# Patient Record
Sex: Male | Born: 1985 | Race: White | Hispanic: No | Marital: Single | Smoking: Never smoker
Health system: Southern US, Community
[De-identification: ages and names within clinical notes are randomized; demographics above are authoritative.]

## PROBLEM LIST (undated history)

## (undated) DIAGNOSIS — E669 Obesity, unspecified: Secondary | ICD-10-CM

## (undated) DIAGNOSIS — J302 Other seasonal allergic rhinitis: Secondary | ICD-10-CM

## (undated) DIAGNOSIS — R12 Heartburn: Secondary | ICD-10-CM

## (undated) DIAGNOSIS — R030 Elevated blood-pressure reading, without diagnosis of hypertension: Secondary | ICD-10-CM

## (undated) DIAGNOSIS — K469 Unspecified abdominal hernia without obstruction or gangrene: Secondary | ICD-10-CM

## (undated) DIAGNOSIS — F4010 Social phobia, unspecified: Secondary | ICD-10-CM

## (undated) DIAGNOSIS — IMO0001 Reserved for inherently not codable concepts without codable children: Secondary | ICD-10-CM

## (undated) DIAGNOSIS — K219 Gastro-esophageal reflux disease without esophagitis: Secondary | ICD-10-CM

## (undated) HISTORY — DX: Other seasonal allergic rhinitis: J30.2

## (undated) HISTORY — DX: Heartburn: R12

## (undated) HISTORY — DX: Social phobia, unspecified: F40.10

## (undated) HISTORY — DX: Obesity, unspecified: E66.9

## (undated) HISTORY — DX: Elevated blood-pressure reading, without diagnosis of hypertension: R03.0

## (undated) HISTORY — PX: OTHER SURGICAL HISTORY: SHX169

## (undated) HISTORY — DX: Reserved for inherently not codable concepts without codable children: IMO0001

---

## 2012-03-07 ENCOUNTER — Other Ambulatory Visit: Payer: Self-pay | Admitting: Family Medicine

## 2012-03-07 LAB — BASIC METABOLIC PANEL
Calcium, Total: 8.8 mg/dL (ref 8.5–10.1)
Chloride: 108 mmol/L — ABNORMAL HIGH (ref 98–107)
Co2: 27 mmol/L (ref 21–32)
EGFR (African American): >60
EGFR (Non-African Amer.): >60
Glucose: 93 mg/dL (ref 65–99)
Osmolality: 281 (ref 275–301)
Potassium: 3.6 mmol/L (ref 3.5–5.1)
Sodium: 142 mmol/L (ref 136–145)

## 2012-03-07 LAB — LIPID PANEL
Cholesterol: 179 mg/dL (ref 0–200)
HDL Cholesterol: 27 mg/dL — ABNORMAL LOW (ref 40–60)
Triglycerides: 202 mg/dL — ABNORMAL HIGH (ref 0–200)
VLDL Cholesterol, Calc: 40 mg/dL (ref 5–40)

## 2012-03-07 LAB — HEMOGLOBIN A1C: Hemoglobin A1C: 5.4 % (ref 4.2–6.3)

## 2014-10-22 ENCOUNTER — Emergency Department
Admission: EM | Admit: 2014-10-22 | Discharge: 2014-10-22 | Disposition: A | Discharge status: Home / Self Care | Payer: Self-pay | Attending: Emergency Medicine | Admitting: Emergency Medicine

## 2014-10-22 ENCOUNTER — Emergency Department: Payer: Self-pay

## 2014-10-22 ENCOUNTER — Encounter: Payer: Self-pay | Admitting: Emergency Medicine

## 2014-10-22 DIAGNOSIS — M79604 Pain in right leg: Secondary | ICD-10-CM | POA: Insufficient documentation

## 2014-10-22 DIAGNOSIS — Z79899 Other long term (current) drug therapy: Secondary | ICD-10-CM | POA: Insufficient documentation

## 2014-10-22 MED ORDER — ETODOLAC 400 MG PO TABS: 400.0000 mg | ORAL_TABLET | Freq: Two times a day (BID) | ORAL | Status: DC

## 2014-10-22 NOTE — ED Notes (Signed)
Developed pain to right lower leg lastpm w/o injury

## 2014-10-22 NOTE — ED Provider Notes (Signed)
[facility] Emergency Department Provider Note  ____________________________________________  Time seen: 1505  I have reviewed the triage vital signs and the nursing notes.   HISTORY  Chief Complaint Leg Pain    HPI William Cohen is a 29 y.o. male noticed that he was having pain in his right anterior leg last p.m. he states that he has not had a recent injury. He does have a lot of anxiety and has been poking on his leg quite often. And this is also to place him where he is having pain today. He denies having any difficulty walking or bending his leg. He denies any paresthesias, bruising, or throbbing. There is been no swelling. He is taken some Aleve without complete resolution of his pain. He rates his pain currently as a 3 out of 10.  History reviewed. No pertinent past medical history.  There are no active problems to display for this patient.   History reviewed. No pertinent past surgical history.  Current Outpatient Rx  Name  Route  Sig  Dispense  Refill  . etodolac (LODINE) 400 MG tablet   Oral   Take 1 tablet (400 mg total) by mouth 2 (two) times daily.   20 tablet   0     Allergies Review of patient's allergies indicates no known allergies.  No family history on file.  Social History History  Substance Use Topics  . Smoking status: Never Smoker   . Smokeless tobacco: Not on file  . Alcohol Use: No    Review of Systems Constitutional: No fever/chills Eyes: No visual changes. ENT: No sore throat. Cardiovascular: Denies chest pain. Respiratory: Denies shortness of breath. Gastrointestinal: No abdominal pain.  No nausea, no vomiting.  Genitourinary: Negative for dysuria. Musculoskeletal: Negative for back pain. Skin: Negative for rash. Neurological: Negative for headaches, focal weakness or numbness.  10-point ROS otherwise negative.  ____________________________________________   PHYSICAL EXAM:  VITAL SIGNS: ED Triage  Vitals  Enc Vitals Group     BP 10/22/14 1337 137/88 mmHg     Pulse Rate 10/22/14 1337 88     Resp 10/22/14 1337 20     Temp 10/22/14 1337 98 F (36.7 C)     Temp Source 10/22/14 1337 Oral     SpO2 10/22/14 1337 97 %     Weight 10/22/14 1337 300 lb (136.079 kg)     Height 10/22/14 1337 6' (1.829 m)     Head Cir --      Peak Flow --      Pain Score 10/22/14 1338 3     Pain Loc --      Pain Edu? --      Excl. in GC? --     Constitutional: Alert and oriented. Well appearing and in no acute distress. Eyes: Conjunctivae are normal. PERRL. EOMI. Head: Atraumatic. Nose: No congestion/rhinnorhea. Neck: No stridor.   Cardiovascular: Normal rate, regular rhythm. Grossly normal heart sounds.  Good peripheral circulation. Respiratory: Normal respiratory effort.  No retractions. Lungs CTAB. Gastrointestinal: Soft and nontender. No distention. Musculoskeletal: No lower extremity tenderness nor edema.  No joint effusions. There is some tenderness on palpation of the anterior tib-fib area. There is no edema or ecchymosis. Skin is intact. Pulses intact. Motor sensory intact. Neurologic:  Normal speech and language. No gross focal neurologic deficits are appreciated. Speech is normal. No gait instability. Skin:  Skin is warm, dry and intact.  Psychiatric: Mood and affect are normal. Speech and behavior are normal.  ____________________________________________   LABS (all labs ordered are listed, but only abnormal results are displayed)  Labs Reviewed - No data to display ____________________________________________ RADIOLOGY  Right tib-fib x-ray is negative for fracture. Soft tissue unremarkable per radiologist. ____________________________________________   PROCEDURES  Procedure(s) performed: None  Critical Care performed: No  ____________________________________________   INITIAL IMPRESSION / ASSESSMENT AND PLAN / ED COURSE  Pertinent labs & imaging results that were available  during my care of the patient were reviewed by me and considered in my medical decision making (see chart for details).  Patient was given information about advanced access to follow-up on his anxiety problems. He was reassured that this is not anything wrong with his bones  he was encouraged to not "poke" at his leg anymore. ____________________________________________   FINAL CLINICAL IMPRESSION(S) / ED DIAGNOSES  Final diagnoses:  Acute leg pain, right      Tommi RumpsRhonda L Summers, PA-C 10/22/14 1620  Tommi Rumpshonda L Summers, PA-C 10/22/14 1620  Phineas SemenGraydon Goodman, MD 10/27/14 [phone]

## 2015-01-04 ENCOUNTER — Encounter (INDEPENDENT_AMBULATORY_CARE_PROVIDER_SITE_OTHER): Payer: Self-pay

## 2015-01-04 ENCOUNTER — Encounter: Payer: Self-pay | Admitting: General Surgery

## 2015-01-04 ENCOUNTER — Ambulatory Visit (INDEPENDENT_AMBULATORY_CARE_PROVIDER_SITE_OTHER): Payer: Self-pay | Admitting: General Surgery

## 2015-01-04 VITALS — BP 160/92 | HR 98 | Temp 98.3°F | Ht 72.0 in | Wt 322.0 lb

## 2015-01-04 DIAGNOSIS — K429 Umbilical hernia without obstruction or gangrene: Secondary | ICD-10-CM | POA: Insufficient documentation

## 2015-01-04 DIAGNOSIS — M6208 Separation of muscle (nontraumatic), other site: Secondary | ICD-10-CM | POA: Insufficient documentation

## 2015-01-04 NOTE — Progress Notes (Signed)
Patient ID: William Cohen, male   DOB: 05-12-1986, 29 y.o.   MRN: [id number]  History of Present Illness William Cohen is a 29 y.o. male with a recent history of a visible bulge from the mid abdomen. Patient states that over the last 6 months he has noticed a soft bulge in the midline of his midepigastric region. It has always been soft and has never caused pain he states that it is mostly visible with consumption of carbonated beverages which he has stopped. Patient has also had a recent weight gain due to sedentary lifestyle. He denies any fevers chills nausea vomiting diarrhea constipation and difficulty breathing chest pain or any other complaints at all other than weight gain. Per his mother, she reports that his father had a similar bulge was repaired recently. No other complaints or problems addressed today.  Past Medical History History reviewed. No pertinent past medical history.   Patient currently on no medications and has never had surgery.  History reviewed. No pertinent past surgical history.  No Known Allergies  Current Outpatient Prescriptions  Medication Sig Dispense Refill  . etodolac (LODINE) 400 MG tablet Take 1 tablet (400 mg total) by mouth 2 (two) times daily. (Patient not taking: Reported on 01/04/2015) 20 tablet 0   No current facility-administered medications for this visit.    Family History Family History  Problem Relation Age of Onset  . Thyroid disease Mother      No family history other than mother's thyroid disease.  Social History History  Substance Use Topics  . Smoking status: Never Smoker   . Smokeless tobacco: Never Used  . Alcohol Use: No     Patient denies smoking, using any illicit drugs, or any alcohol intake. Patient is currently unemployed   ROS A thorough review of systems was completed and negative other than the history of present illness  Physical Exam Blood pressure 160/92, pulse 98, temperature 98.3 F (36.8 C), temperature  source Oral, height 6' (1.829 m), weight 146.058 kg (322 lb).  CONSTITUTIONAL: Obese, pleasant and appropriate EYES: Pupils equal, round, and reactive to light, Sclera non-icteric. EARS, NOSE, MOUTH AND THROAT: The oropharynx is clear. Oral mucosa is pink and moist. Hearing is intact to voice.  NECK: Trachea is midline, and there is no jugular venous distension. Thyroid is without palpable abnormalities. LYMPH NODES:  Lymph nodes in the neck are not enlarged. RESPIRATORY:  Lungs are clear, and breath sounds are equal bilaterally. Normal respiratory effort without pathologic use of accessory muscles. CARDIOVASCULAR: Heart is regular without murmurs, gallops, or rubs. GI: The abdomen is large, soft, nontender, and nondistended. The bulge of concern was in the upper midline between the rectus muscles most consistent with a diastases recti. Upon thorough examination patient was also noted to have a small umbilical hernia that was easily reducible and asymptomatic. There were no palpable masses. There was no hepatosplenomegaly. There were normal bowel sounds. GU: Deferred MUSCULOSKELETAL:  Normal muscle strength and tone in all four extremities.    SKIN: Skin turgor is normal. There are no pathologic skin lesions.  NEUROLOGIC:  Motor and sensation is grossly normal.  Cranial nerves are grossly intact. PSYCH:  Alert and oriented to person, place and time. Affect is normal.  Data Reviewed Previous chart reviewed no images and labs.  I have personally reviewed the patient's imaging and medical records.    Assessment    29 year old obese male with asymptomatic diastases recti and umbilical hernia.  Plan  Had a long conversation with the patient and his mother about the roles of obesity and diastases recti and hernias. Discussed that his current weight that although surgical repair is possible it would be at higher risk of recurrence and of complication due to the patient's size. Also discussed  that due to the asymptomatic nature of both the diastases and the umbilical hernia that there would be no surgical urgency or emergency at this time. Counseled with the patient and the mother about the signs and symptoms of incarceration and strangulation of both the hernia or the diastases should they occur and to report to the nearest emergency room upon onset. All questions answered and patient and mother both relieved that no surgery was currently required.  Face-to-face time spent with the patient and care providers was 30 minutes, with more than 50% of the time spent counseling, educating, and coordinating care of the patient.     Ricarda Frame 01/04/2015, 10:20 AM

## 2015-01-04 NOTE — Patient Instructions (Signed)

## 2015-01-06 ENCOUNTER — Ambulatory Visit: Payer: Self-pay | Admitting: Surgery

## 2015-06-03 ENCOUNTER — Encounter: Payer: Self-pay | Admitting: Family Medicine

## 2015-06-03 ENCOUNTER — Ambulatory Visit (INDEPENDENT_AMBULATORY_CARE_PROVIDER_SITE_OTHER): Payer: Self-pay | Admitting: Family Medicine

## 2015-06-03 VITALS — BP 130/78 | HR 123 | Temp 97.7°F | Resp 16 | Ht 70.0 in | Wt 304.0 lb

## 2015-06-03 DIAGNOSIS — Z6841 Body Mass Index (BMI) 40.0 and over, adult: Secondary | ICD-10-CM | POA: Insufficient documentation

## 2015-06-03 DIAGNOSIS — F419 Anxiety disorder, unspecified: Principal | ICD-10-CM

## 2015-06-03 DIAGNOSIS — F418 Other specified anxiety disorders: Secondary | ICD-10-CM

## 2015-06-03 DIAGNOSIS — F329 Major depressive disorder, single episode, unspecified: Secondary | ICD-10-CM | POA: Insufficient documentation

## 2015-06-03 DIAGNOSIS — E669 Obesity, unspecified: Secondary | ICD-10-CM | POA: Insufficient documentation

## 2015-06-03 DIAGNOSIS — Z23 Encounter for immunization: Secondary | ICD-10-CM

## 2015-06-03 MED ORDER — ESCITALOPRAM OXALATE 10 MG PO TABS: 10.0000 mg | ORAL_TABLET | Freq: Every day | ORAL | Status: DC

## 2015-06-03 MED ORDER — CLONAZEPAM 0.5 MG PO TABS: 0.2500 mg | ORAL_TABLET | ORAL | Status: DC | PRN

## 2015-06-03 MED ORDER — CHOLECALCIFEROL 50 MCG (2000 UT) PO CAPS
1.0000 | ORAL_CAPSULE | Freq: Every day | ORAL | Status: AC
Start: 2015-06-03 — End: ?

## 2015-06-03 NOTE — Progress Notes (Signed)
Subjective:    Patient ID: William Cohen, male    DOB: April 01, 1986, 29 y.o.   MRN: [id number]  HPI: William Cohen is a 29 y.o. male presenting on 06/03/2015 for Anxiety   HPI  Pt presents to establish care today. Previous care provider was [facility] and Dr. Lahoma Rocker 3 years ago.  It has been 3 years since Her last PCP visit. Records from previous provider will be requested and reviewed. Current medical problems include:  Anxiety: Symptoms began last Monday with a panic attack. Became alarmed by bad news- felt very alarmed. Felt hard to breath.   Sleeping a lot more than usual. Has a history of social anxiety. Was in therapy- trying to use his coping skills.  GERD: Occasional symptoms. Very well controlled- diet controlled.  Elevated BP in the past without HTN. BP is elevated today.   Health maintenance:  Labs done in 2013- WNL. Not employed.   Flu shot done today.    Past Medical History  Diagnosis Date  . Phobia, social   . Seasonal allergies   . Heartburn   . Obesity   . Elevated blood pressure    Social History   Social History  . Marital Status: Single    Spouse Name: N/A  . Number of Children: N/A  . Years of Education: N/A   Occupational History  . Not on file.   Social History Main Topics  . Smoking status: Never Smoker   . Smokeless tobacco: Never Used  . Alcohol Use: No  . Drug Use: No  . Sexual Activity: Not on file   Other Topics Concern  . Not on file   Social History Narrative   Family History  Problem Relation Age of Onset  . Thyroid disease Mother   . Anxiety disorder Mother   . Diabetes Maternal Grandfather   . Heart disease Maternal Grandfather   . Stroke Maternal Grandfather    Current Outpatient Prescriptions on File Prior to Visit  Medication Sig  . etodolac (LODINE) 400 MG tablet Take 1 tablet (400 mg total) by mouth 2 (two) times daily. (Patient not taking: Reported on 06/03/2015)   No current facility-administered  medications on file prior to visit.    Review of Systems  Constitutional: Negative for fever and chills.  HENT: Negative.   Respiratory: Negative for chest tightness, shortness of breath and wheezing.   Cardiovascular: Negative for chest pain, palpitations and leg swelling.  Gastrointestinal: Negative for nausea, vomiting and abdominal pain.  Endocrine: Negative.   Genitourinary: Negative for dysuria, urgency, discharge, penile pain and testicular pain.  Musculoskeletal: Negative for back pain, joint swelling and arthralgias.  Skin: Negative.   Neurological: Negative for dizziness, weakness, numbness and headaches.  Psychiatric/Behavioral: Positive for dysphoric mood and decreased concentration. Negative for suicidal ideas, behavioral problems and sleep disturbance. The patient is nervous/anxious.    Per HPI unless specifically indicated above     Objective:    BP 130/78 mmHg  Pulse 123  Temp(Src) 97.7 F (36.5 C) (Oral)  Resp 16  Ht  (1.778 m)  Wt 304 lb (137.893 kg)  BMI 43.62 kg/m2  Wt Readings from Last 3 Encounters:  06/03/15 304 lb (137.893 kg)  01/04/15 322 lb (146.058 kg)  10/22/14 300 lb (136.079 kg)    Depression screen PHQ 2/9 06/03/2015  Down, Depressed, Hopeless 3  PHQ - 2 Score 3  Altered sleeping 3  Tired, decreased energy 3  Change in appetite 3  Feeling  bad or failure about yourself  2  Trouble concentrating 2  Moving slowly or fidgety/restless 2  Suicidal thoughts 0  PHQ-9 Score 18  Difficult doing work/chores Very difficult    GAD 7 : Generalized Anxiety Score 06/03/2015  Nervous, Anxious, on Edge 3  Control/stop worrying 3  Worry too much - different things 3  Trouble relaxing 3  Restless 3  Easily annoyed or irritable 0  Afraid - awful might happen 3  Total GAD 7 Score 18  Anxiety Difficulty Very difficult      Physical Exam  Constitutional: He is oriented to person, place, and time. He appears well-developed and  well-nourished. No distress.  HENT:  Head: Normocephalic and atraumatic.  Neck: Neck supple. No thyromegaly present.  Cardiovascular: Normal rate, regular rhythm and normal heart sounds.  Exam reveals no gallop and no friction rub.   No murmur heard. Pulmonary/Chest: Effort normal and breath sounds normal. He has no wheezes.  Abdominal: Soft. Bowel sounds are normal. He exhibits no distension. There is no tenderness. There is no rebound.  Musculoskeletal: Normal range of motion. He exhibits no edema or tenderness.  Neurological: He is alert and oriented to person, place, and time. He has normal reflexes.  Skin: Skin is warm and dry. No rash noted. No erythema.  Psychiatric: His speech is normal and behavior is normal. Judgment and thought content normal. His mood appears anxious. Cognition and memory are normal.       Assessment & Plan:   Problem List Items Addressed This Visit      Other   Anxiety and depression - Primary    Start medication today. Benzo for PRN panic attacks until fully on escitalopram. Reviewed risks and benefits of medication.  Encouraged daily exercise to help with symptoms. Encouraged vitamin D OTC.  Discussed options of therapy for anxiety. RTC 1 mos.       Relevant Medications   escitalopram (LEXAPRO) 10 MG tablet   clonazePAM (KLONOPIN) 0.5 MG tablet   BMI 40.0-44.9, adult (HCC)    Encouraged 150 minutes of exercise per week to help increase overall health.        Other Visit Diagnoses    Need for influenza vaccination        Relevant Orders    Flu Vaccine QUAD 36+ mos PF IM (Fluarix & Fluzone Quad PF) (Completed)       Meds ordered this encounter  Medications  . escitalopram (LEXAPRO) 10 MG tablet    Sig: Take 1 tablet (10 mg total) by mouth daily.    Dispense:  30 tablet    Refill:  11    Order Specific Question:  Supervising Provider    Answer:  Janeann ForehandHAWKINS JR, JAMES H [phone][[id number]]  . clonazePAM (KLONOPIN) 0.5 MG tablet    Sig: Take 0.5 tablets  (0.25 mg total) by mouth as needed for anxiety.    Dispense:  10 tablet    Refill:  0    Order Specific Question:  Supervising Provider    Answer:  Janeann ForehandHAWKINS JR, JAMES H [phone][[id number]]  . Cholecalciferol 2000 units CAPS    Sig: Take 1 capsule (2,000 Units total) by mouth daily.    Dispense:  30 each    Order Specific Question:  Supervising Provider    Answer:  Janeann ForehandHAWKINS JR, JAMES H [[id number]]      Follow up plan: Return in about 4 weeks (around 07/01/2015) for anxiety.

## 2015-06-03 NOTE — Assessment & Plan Note (Signed)
Encouraged 150 minutes of exercise per week to help increase overall health.

## 2015-06-03 NOTE — Assessment & Plan Note (Signed)
Start medication today. Benzo for PRN panic attacks until fully on escitalopram. Reviewed risks and benefits of medication.  Encouraged daily exercise to help with symptoms. Encouraged vitamin D OTC.  Discussed options of therapy for anxiety. RTC 1 mos.

## 2015-06-03 NOTE — Patient Instructions (Signed)
Lets start treating your anxiety with a medication today. Start escitalopram 10 mg once daily. To get started take 1/2 pill at bedtime for 4 days and then resume the full dose.   For panic attacks I have given you a few tablets of clonazepam. Take 1/2 pill at the start of panic attack as needed.   We will check back on your symptoms in 1 month.

## 2016-06-13 ENCOUNTER — Other Ambulatory Visit: Payer: Self-pay | Admitting: Family Medicine

## 2016-06-13 DIAGNOSIS — F329 Major depressive disorder, single episode, unspecified: Secondary | ICD-10-CM

## 2016-06-13 DIAGNOSIS — F419 Anxiety disorder, unspecified: Principal | ICD-10-CM

## 2016-06-13 MED ORDER — ESCITALOPRAM OXALATE 10 MG PO TABS: 10.0000 mg | ORAL_TABLET | Freq: Every day | ORAL | 2 refills | Status: DC

## 2016-06-13 NOTE — Telephone Encounter (Signed)
Last visit with prior PCP William Cohen in 05/2015 to establish care and manage Anxiety. He was treated with Escitalopram for anxiety, given 1 year supply medication. He was asked to follow-up in 1 month, has not been to office or called since that visit 05/2015.  Received refill request for [facility]ram 10mg  daily[facility] [place] (however this pharmacy was not listed on his pharmacy list). I [facility], #[facility] pills +2 refills, for total 3 month supply.  Patient will need to schedule office visit to follow-up within next 3 months to continue receiving refills for this medication. I have never seen this patient in office, and they may schedule with me or with our incoming new provider replacing Amy Cohen anytime in next 3 months.  William PilarAlexander Smaran Gaus, DO [facility] 06/13/2016, 6:10 PM

## 2016-06-13 NOTE — Telephone Encounter (Signed)
[facility] said [facility] needs a refill on [facility]ram [facility] [place]  Her call back number is [phone]

## 2016-06-14 ENCOUNTER — Encounter: Payer: Self-pay | Admitting: Family Medicine

## 2016-06-14 ENCOUNTER — Encounter: Payer: Self-pay | Admitting: *Deleted

## 2016-06-14 ENCOUNTER — Ambulatory Visit (INDEPENDENT_AMBULATORY_CARE_PROVIDER_SITE_OTHER): Payer: Self-pay | Admitting: Family Medicine

## 2016-06-14 VITALS — BP 130/78 | HR 91 | Temp 97.7°F | Resp 16 | Ht 70.0 in | Wt 321.6 lb

## 2016-06-14 DIAGNOSIS — F418 Other specified anxiety disorders: Secondary | ICD-10-CM

## 2016-06-14 DIAGNOSIS — F419 Anxiety disorder, unspecified: Principal | ICD-10-CM

## 2016-06-14 DIAGNOSIS — R41840 Attention and concentration deficit: Secondary | ICD-10-CM | POA: Insufficient documentation

## 2016-06-14 DIAGNOSIS — F329 Major depressive disorder, single episode, unspecified: Secondary | ICD-10-CM

## 2016-06-14 MED ORDER — ESCITALOPRAM OXALATE 10 MG PO TABS: 10.0000 mg | ORAL_TABLET | Freq: Every day | ORAL | 11 refills | Status: DC

## 2016-06-14 NOTE — Telephone Encounter (Signed)
Patient has appointment 06/14/16.

## 2016-06-14 NOTE — Assessment & Plan Note (Signed)
Significant improvement over past 1 year on SSRI Lexapro, seems primary etiology of symptoms were anxiety with panic, possible more GAD. History of depression but does not seem to endorse this, associated with some insomnia much improved on medication. - Off of BDZ therapy was only on temporary bridge Clonazepam until stable on SSRI - Possible ADD component by history, no prior testing  Plan: 1. Refilled Escitalopram 10mg  daily #30 +11 refills, sent to [facility] (earlier rx see refill note, [facility] changed mind) 2. Discussed discontinue clonazepam, not ideal for chronic management of anxiety, especially if he has not used in many months and feels comfortable without it 3. Offered future self referral to therapy if interested, handout given 4. Consider future ADD testing per local Elna BreslowBruce Thompson or other, handout given, however if uninsured may not be able to afford testing, consider [facility] as potential option 5. Follow-up 6 months for Annual physical and labs

## 2016-06-14 NOTE — Patient Instructions (Signed)
Thank you for coming in to clinic today.  1. Refilled Escitalopram (Lexapro) 10mg  once daily, no dose change. In future, if needed we could always increase up to 20mg .  2. For ADD concern with focus.. Consider contacting [facility]na BreslowB[facility] Thompson for initial testing (unfortunately may be expensive if no insurance coverage, you can also consider [facility] or [facility] setting for other alternative options  Drema BalzarineBruce R Thompson PHD, local Psychologist [address] [place], [place] [address] [phone]  ----------------------  1. [name]   Address: [address], [place], [place] [address] Hours: Open today  9AM-7PM Phone: [phone]  2. [name] [employer] Address: [address], [place], [place] [address] Phone: [phone]   [facility]   Address: [address] [place] [address], [place], [place] [address] Hours: Tues 4:15PM-8PM // Weds 9am - 12pm // Magdalene Mollyhurs 1pm - 8pm (Closed other days) Phone: [phone]  Self Referral [facility]) [place] [address], [place], [place] [address] Phone: [phone]  You will be due for FASTING BLOOD WORK (no food or drink after midnight before, only water or coffee without cream/sugar on the morning of)  - Please go ahead and schedule a "Lab Only" visit in the morning at the clinic for lab draw anytime in next 6 months - Make sure Lab Only appointment is at least 1-2 weeks before your next appointment, so that results will be available  For Lab Results, once available within 2-3 days of blood draw, you can can log in to MyChart online to view your results and a brief explanation. Also, we can discuss results at next follow-up visit.  Please schedule a follow-up appointment with Dr. Althea CharonKaramalegos in 6 months for Annual Physical  If you have any other questions or concerns, please feel free to call the clinic or  send a message through MyChart. You may also schedule an earlier appointment if necessary.  William PilarAlexander Ashrith Sagan, DO [facility]

## 2016-06-14 NOTE — Progress Notes (Signed)
Subjective:    Patient ID: William Cohen, male    DOB: [birth year], 31 y.o.   MRN: [id number]  William Cohen is a 31 y.o. male presenting on 06/14/2016 for Anxiety  Patient presents for a same day appointment.  HPI   ANXIETY with Panic with history of depression: - Reports chronic history of Anxiety with panic disorder, last visit to [facility] 05/2015 established care with prior PCP, at that time initiated therapy with escitalopram, also given clonzepam PRN for panic. - Today he presents to follow-up and request medication refill. Overall he has done well on Escitalopram 100m daily, he only took the Clonazepam PRN when severe anxiety and understood this was temporary bridge medication, he has since finished it and no longer taking it. States he has not had a full blown panic attack in a long time, >1 year, but does have occasional moments of panic however he feels like he can control these much better now. - Previously had more of problem with insomnia, now much improved. Still occasional difficulty but much more manageable - Additionally he reports concern for some inattention or difficulty focusing, he inquires about this as he has never explored this option of a possible ADD diagnosis. He does admit when he was younger he was told he might have Asperger's but it was not an actual medical diagnosis. Does have some forgetfulness and hard to stay on task at times, no significant improvement since on SSRI, never tested for ADD and never treated - Denies any significant symptoms of depression (declines PHQ questionnaire) - Denies any suicidal or homicidal ideation  Health Maintenance: - Declines Flu shot  Family History  Problem Relation Age of Onset  . Thyroid disease Mother   . Anxiety disorder Mother   . Diabetes Maternal Grandfather   . Heart disease Maternal Grandfather   . Stroke Maternal Grandfather    Social History  Substance Use Topics  . Smoking status: Never Smoker  .  Smokeless tobacco: Never Used  . Alcohol use No   GAD 7 : Generalized Anxiety Score 06/14/2016 06/03/2015  Nervous, Anxious, on Edge 0 3  Control/stop worrying 0 3  Worry too much - different things 0 3  Trouble relaxing 0 3  Restless 0 3  Easily annoyed or irritable 1 0  Afraid - awful might happen 1 3  Total GAD 7 Score 2 18  Anxiety Difficulty Not difficult at all Very difficult    Review of Systems Per HPI unless specifically indicated above     Objective:    BP 130/78   Pulse 91   Temp 97.7 F (36.5 C) (Oral)   Resp 16   Ht _0  (1.778 m)   Wt (!) 321 lb 9.6 oz (145.9 kg)   BMI 46.14 kg/m   Wt Readings from Last 3 Encounters:  06/14/16 (!) 321 lb 9.6 oz (145.9 kg)  06/03/15 (!) 304 lb (137.9 kg)  01/04/15 (!) 322 lb (146.1 kg)    Physical Exam  Constitutional: He appears well-developed and well-nourished. No distress.  Well-appearing, comfortable, cooperative, obese  HENT:  Head: Normocephalic and atraumatic.  Mouth/Throat: Oropharynx is clear and moist.  Cardiovascular: Normal rate.   Pulmonary/Chest: Effort normal.  Neurological: He is alert.  Skin: Skin is warm and dry. He is not diaphoretic.  Psychiatric: His behavior is normal.  Well groomed, normal mood and congruent affect, does appear mildly anxious, good eye contact, normal speech and thoughts  Nursing note and vitals reviewed.  Results for orders placed or performed in visit on 70/96/28  Basic metabolic panel  Result Value Ref Range   Glucose 93 65 - 99 mg/dL   BUN 8 7 - 18 mg/dL   Creatinine 0.91 0.60 - 1.30 mg/dL   Sodium 142 136 - 145 mmol/L   Potassium 3.6 3.5 - 5.1 mmol/L   Chloride 108 (H) 98 - 107 mmol/L   Co2 27 21 - 32 mmol/L   Calcium, Total 8.8 8.5 - 10.1 mg/dL   Osmolality 281 275 - 301   Anion Gap 7 7 - 16   EGFR (African American) >60    EGFR (Non-African Amer.) >60   Lipid panel  Result Value Ref Range   Cholesterol 179 0 - 200 mg/dL   Triglycerides 202 (H) 0 - 200  mg/dL   HDL Cholesterol 27 (L) 40 - 60 mg/dL   VLDL Cholesterol, Calc 40 5 - 40 mg/dL   Ldl Cholesterol, Calc 112 (H) 0 - 100 mg/dL  Hemoglobin A1c  Result Value Ref Range   Hemoglobin A1C 5.4 4.2 - 6.3 %      Assessment & Plan:   Problem List Items Addressed This Visit    Inattention    Possible underlying chronic component of ADD, see A&P for Anxiety for other details Consider future testing      Anxiety and depression - Primary    Significant improvement over past 1 year on SSRI Lexapro, seems primary etiology of symptoms were anxiety with panic, possible more GAD. History of depression but does not seem to endorse this, associated with some insomnia much improved on medication. - Off of BDZ therapy was only on temporary bridge Clonazepam until stable on SSRI - Possible ADD component by history, no prior testing  Plan: 1. Refilled Escitalopram 66m daily #30 +11 refills, sent to [facility] (earlier rx see refill note, was [facility] patient changed mind) 2. Discussed discontinue clonazepam, not ideal for chronic management of anxiety, especially if he has not used in many months and feels comfortable without it 3. Offered future self referral to therapy if interested, handout given 4. Consider future ADD testing per local [facility] other, handout given, however if uninsured may not be able to afford testing, consider [facility] as potential option 5. Follow-up 6 months for Annual physical and labs      Relevant Medications   escitalopram (LEXAPRO) 10 MG tablet      Meds ordered this encounter  Medications  . escitalopram (LEXAPRO) 10 MG tablet    Sig: Take 1 tablet (10 mg total) by mouth daily.    Dispense:  30 tablet    Refill:  11      Follow up plan: Return in about 6 months (around 12/12/2016) for Annual physical.  ANobie Putnam DO [facility] 06/14/2016, 9:36 PM

## 2016-06-14 NOTE — Assessment & Plan Note (Signed)
Possible underlying chronic component of ADD, see A&P for Anxiety for other details Consider future testing

## 2016-07-31 IMAGING — CR DG TIBIA/FIBULA 2V*R*
1 series · 4 of 4 positions shown · non-contrast
Comparison: None.

CLINICAL DATA: 28-year-old male with a history of leg pain.
Questionable trauma.

EXAM:
RIGHT TIBIA AND FIBULA - 2 VIEW

[Series 1: ap · 0.17mm/px · 4 of 4 slices shown]
[im 1/4]
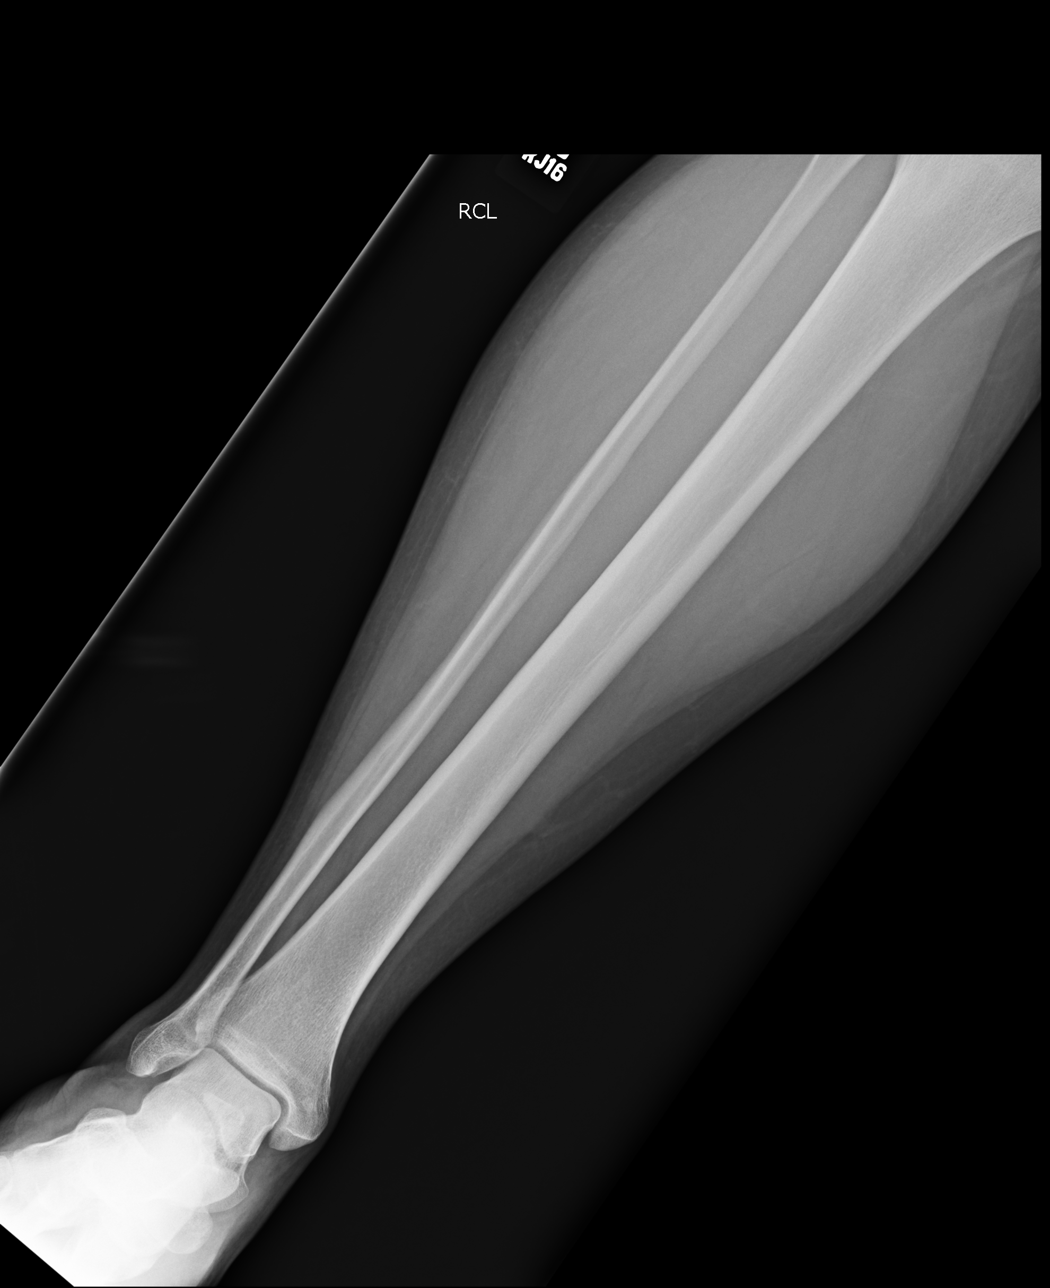
[im 2/4]
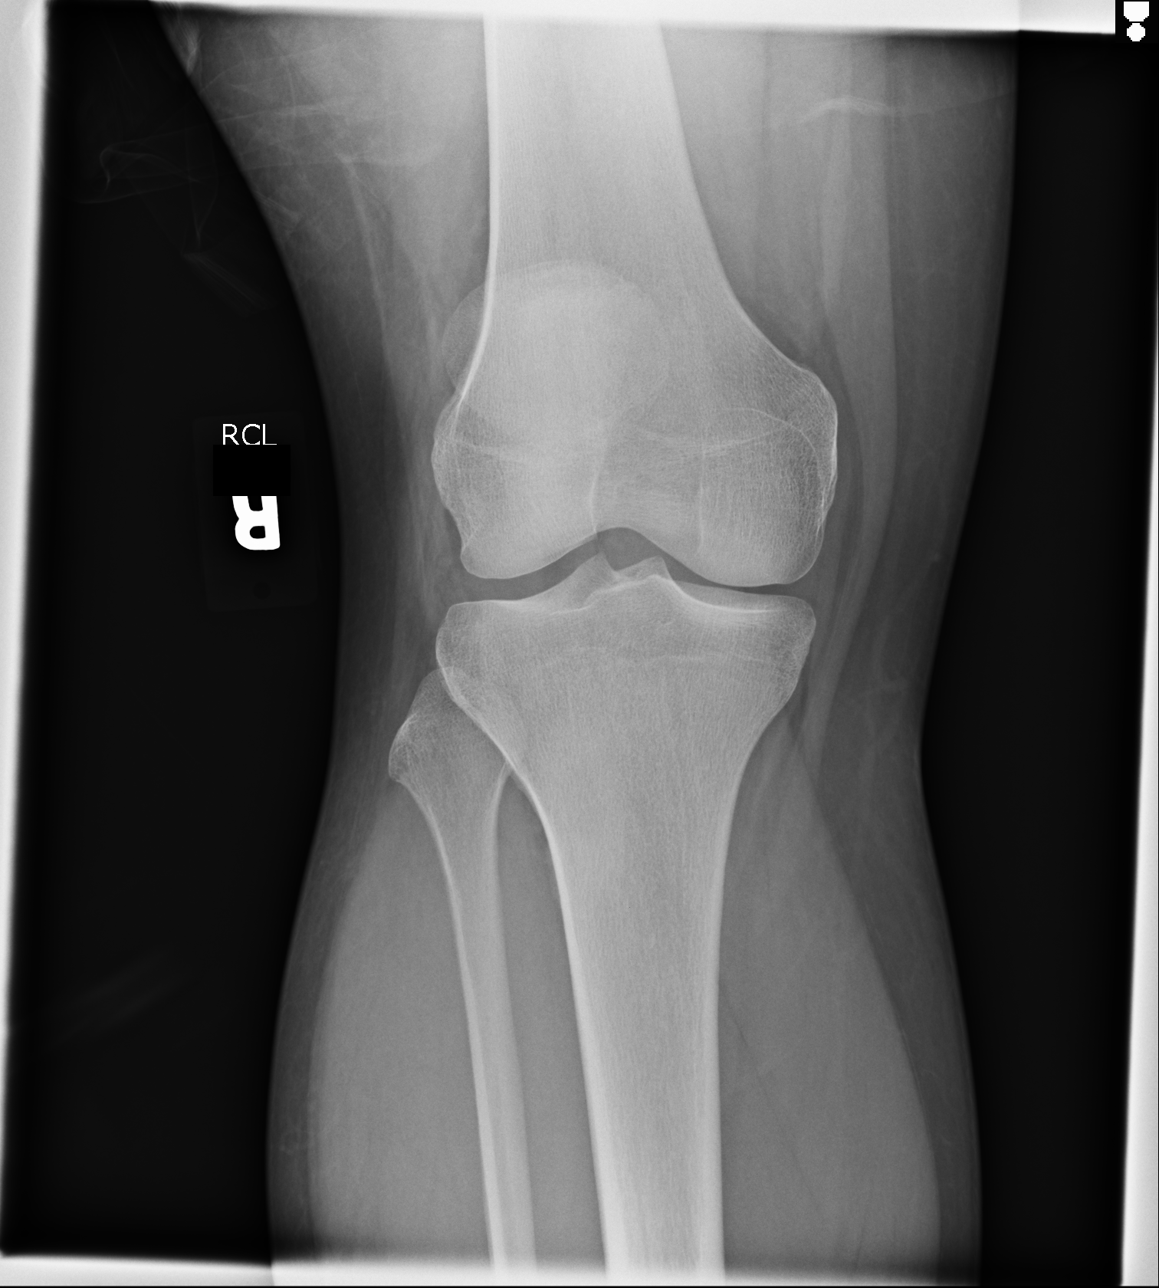
[im 3/4]
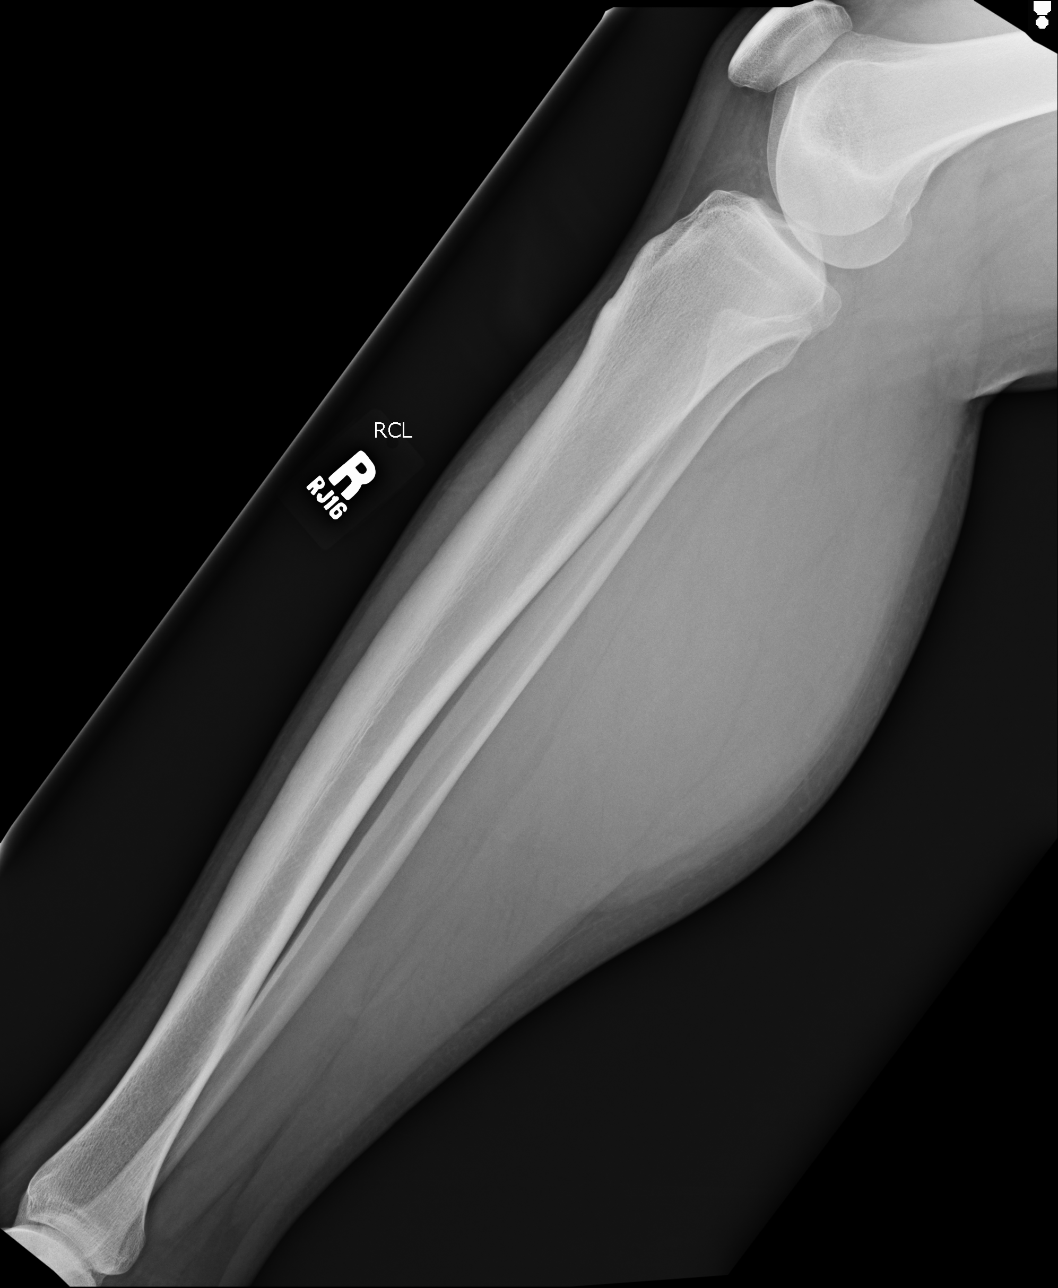
[im 4/4]
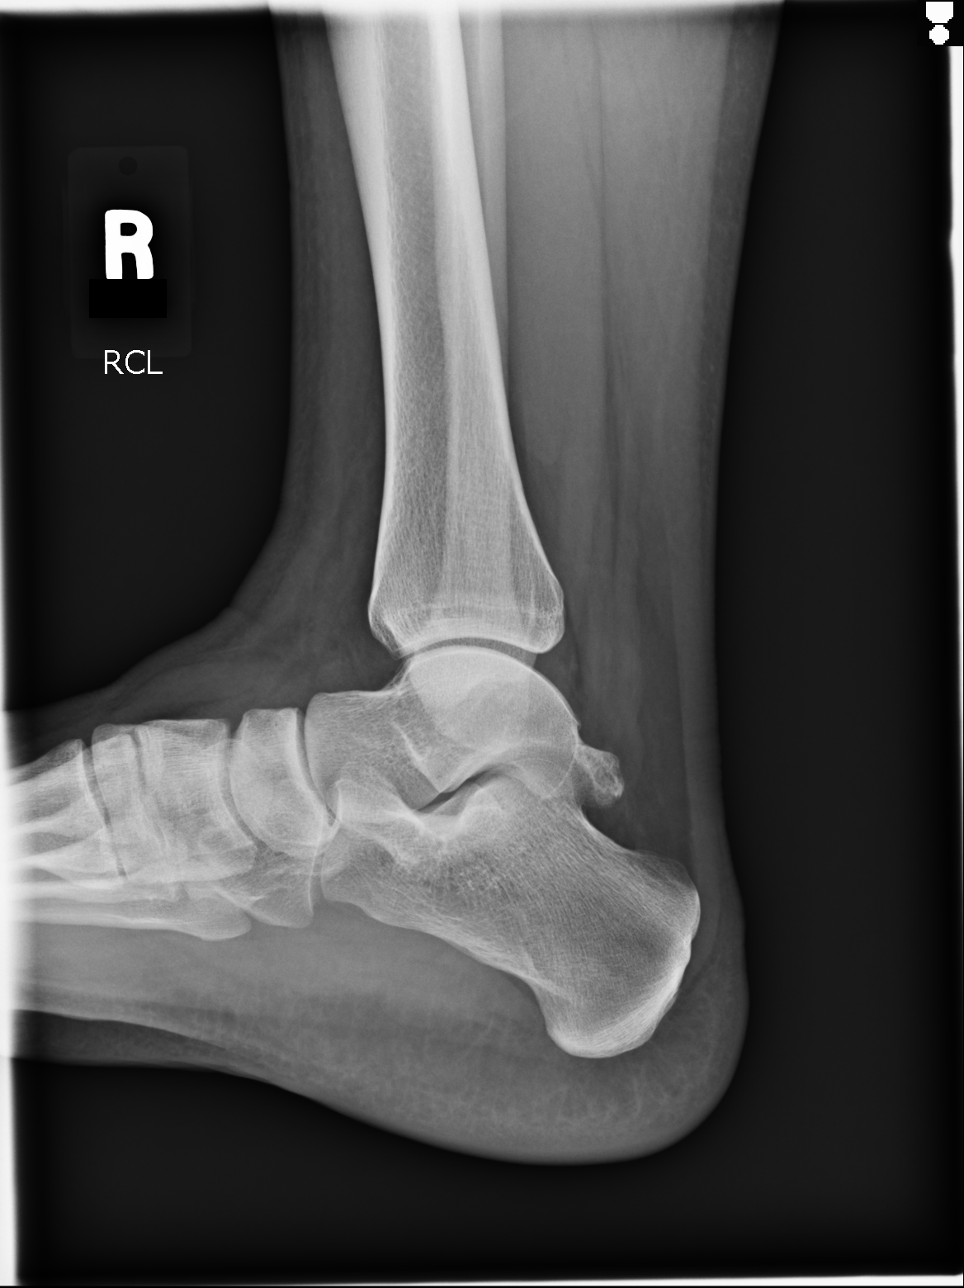

[4 of 4 positions shown; findings below may reference images not displayed]

FINDINGS: There is no evidence of fracture or other focal bone lesions. Soft
tissues are unremarkable.
IMPRESSION: Negative for acute bony abnormality.

## 2017-05-28 ENCOUNTER — Other Ambulatory Visit: Payer: Self-pay | Admitting: Family Medicine

## 2017-05-28 DIAGNOSIS — F32A Depression, unspecified: Secondary | ICD-10-CM

## 2017-05-28 DIAGNOSIS — F329 Major depressive disorder, single episode, unspecified: Secondary | ICD-10-CM

## 2017-05-28 DIAGNOSIS — F419 Anxiety disorder, unspecified: Principal | ICD-10-CM

## 2017-10-05 ENCOUNTER — Other Ambulatory Visit: Payer: Self-pay | Admitting: Family Medicine

## 2017-10-05 DIAGNOSIS — F419 Anxiety disorder, unspecified: Principal | ICD-10-CM

## 2017-10-05 DIAGNOSIS — F329 Major depressive disorder, single episode, unspecified: Secondary | ICD-10-CM

## 2017-10-05 DIAGNOSIS — F32A Depression, unspecified: Secondary | ICD-10-CM

## 2017-11-08 ENCOUNTER — Other Ambulatory Visit: Payer: Self-pay | Admitting: Family Medicine

## 2017-11-08 DIAGNOSIS — F419 Anxiety disorder, unspecified: Principal | ICD-10-CM

## 2017-11-08 DIAGNOSIS — F329 Major depressive disorder, single episode, unspecified: Secondary | ICD-10-CM

## 2017-11-09 ENCOUNTER — Other Ambulatory Visit: Payer: Self-pay | Admitting: Family Medicine

## 2017-11-09 ENCOUNTER — Telehealth: Payer: Self-pay | Admitting: Student

## 2017-11-09 DIAGNOSIS — F419 Anxiety disorder, unspecified: Principal | ICD-10-CM

## 2017-11-09 DIAGNOSIS — F329 Major depressive disorder, single episode, unspecified: Secondary | ICD-10-CM

## 2017-11-09 NOTE — Telephone Encounter (Signed)
Pt needs a refill on escitalopram sent to [facility].  The call back number is [phone]

## 2017-11-12 NOTE — Telephone Encounter (Signed)
The pt was notified that he needs to make an appointment. He was last seen about 1.5 years ago. He stated that he will call back and schedule an appt.

## 2017-12-18 ENCOUNTER — Other Ambulatory Visit: Payer: Self-pay | Admitting: Family Medicine

## 2017-12-18 DIAGNOSIS — F32A Depression, unspecified: Secondary | ICD-10-CM

## 2017-12-18 DIAGNOSIS — F419 Anxiety disorder, unspecified: Principal | ICD-10-CM

## 2017-12-18 DIAGNOSIS — F329 Major depressive disorder, single episode, unspecified: Secondary | ICD-10-CM

## 2018-02-23 ENCOUNTER — Encounter: Payer: Self-pay | Admitting: Gynecology

## 2018-02-23 ENCOUNTER — Ambulatory Visit
Admission: EM | Admit: 2018-02-23 | Discharge: 2018-02-23 | Disposition: A | Discharge status: Home / Self Care | Payer: Self-pay | Attending: Family Medicine | Admitting: Family Medicine

## 2018-02-23 ENCOUNTER — Other Ambulatory Visit: Payer: Self-pay

## 2018-02-23 DIAGNOSIS — K219 Gastro-esophageal reflux disease without esophagitis: Secondary | ICD-10-CM

## 2018-02-23 DIAGNOSIS — R110 Nausea: Secondary | ICD-10-CM

## 2018-02-23 HISTORY — DX: Gastro-esophageal reflux disease without esophagitis: K21.9

## 2018-02-23 HISTORY — DX: Unspecified abdominal hernia without obstruction or gangrene: K46.9

## 2018-02-23 MED ORDER — ONDANSETRON 8 MG PO TBDP: 8.0000 mg | ORAL_TABLET | Freq: Three times a day (TID) | ORAL | 0 refills | Status: AC | PRN

## 2018-02-23 NOTE — Discharge Instructions (Addendum)
Over the counter prilosec take 2 per day Recommend follow up with gastroenterologist

## 2018-02-23 NOTE — ED Provider Notes (Signed)
[facility]    CSN: [id number] Arrival date & time: 02/23/18  1326     History   Chief Complaint Chief Complaint  Patient presents with  . stomach issue    HPI William Cohen is a 32 y.o. male.   32 yo male with a c/o intermittent stomach pains, acid reflux, nausea and sensation of food getting stuck when he swallows. States he has a long history of GERD since he was a child. Recently started taking ranitidine without relief. States he also eats a lot of spicy food.   The history is provided by the patient.    Past Medical History:  Diagnosis Date  . Elevated blood pressure   . GERD (gastroesophageal reflux disease)   . Heartburn   . Hernia of abdominal cavity   . Obesity   . Phobia, social   . Seasonal allergies     Patient Active Problem List   Diagnosis Date Noted  . Inattention 06/14/2016  . Obesity 06/03/2015  . Anxiety and depression 06/03/2015  . BMI 40.0-44.9, adult (HCC) 06/03/2015  . Umbilical hernia without obstruction and without gangrene 01/04/2015  . Diastasis recti 01/04/2015    Past Surgical History:  Procedure Laterality Date  . none         Home Medications    Prior to Admission medications   Medication Sig Start Date End Date Taking? Authorizing Provider  escitalopram (LEXAPRO) 10 MG tablet TAKE 1 TABLET BY MOUTH ONCE DAILY 10/05/17  Yes Karamalegos, Netta NeatAlexander J, DO  ranitidine (ZANTAC) 150 MG capsule Take 150 mg by mouth 2 (two) times daily.   Yes [provider]  Cholecalciferol 2000 units CAPS Take 1 capsule (2,000 Units total) by mouth daily. 06/03/15   Krebs, Laurel DimmerAmy Lauren, NP  ondansetron (ZOFRAN ODT) 8 MG disintegrating tablet Take 1 tablet (8 mg total) by mouth every 8 (eight) hours as needed. 02/23/18   Payton Mccallumonty, Maisen Klingler, MD    Family History Family History  Problem Relation Age of Onset  . Thyroid disease Mother   . Anxiety disorder Mother   . Diabetes Maternal Grandfather   . Heart disease Maternal  Grandfather   . Stroke Maternal Grandfather     Social History Social History   Tobacco Use  . Smoking status: Never Smoker  . Smokeless tobacco: Never Used  Substance Use Topics  . Alcohol use: No  . Drug use: No     Allergies   Patient has no known allergies.   Review of Systems Review of Systems   Physical Exam Triage Vital Signs ED Triage Vitals  Enc Vitals Group     BP 02/23/18 1359 (!) 146/96     Pulse Rate 02/23/18 1359 77     Resp 02/23/18 1359 16     Temp 02/23/18 1359 98.2 F (36.8 C)     Temp Source 02/23/18 1359 Oral     SpO2 02/23/18 1359 99 %     Weight 02/23/18 1356 (!) 330 lb (149.7 kg)     Height 02/23/18 1356 5\' 10"  (1.778 m)     Head Circumference --      Peak Flow --      Pain Score 02/23/18 1355 2     Pain Loc --      Pain Edu? --      Excl. in [place]? --    No data found.  Updated Vital Signs BP (!) 146/96 (BP Location: Left Arm)   Pulse 77   Temp 98.2  F (36.8 C) (Oral)   Resp 16   Ht 5\' 10"  (1.778 m)   Wt (!) 149.7 kg   SpO2 99%   BMI 47.35 kg/m   Visual Acuity Right Eye Distance:   Left Eye Distance:   Bilateral Distance:    Right Eye Near:   Left Eye Near:    Bilateral Near:     Physical Exam  Constitutional: He is oriented to person, place, and time. He appears well-developed and well-nourished. No distress.  Cardiovascular: Normal rate, regular rhythm, normal heart sounds and intact distal pulses.  No murmur heard. Pulmonary/Chest: Effort normal and breath sounds normal. No stridor. No respiratory distress. He has no wheezes. He has no rales.  Abdominal: Soft. Bowel sounds are normal. He exhibits no distension and no mass. There is no tenderness. There is no rebound and no guarding.  Neurological: He is alert and oriented to person, place, and time.  Skin: No rash noted. He is not diaphoretic.  Nursing note and vitals reviewed.    UC Treatments / Results  Labs (all labs ordered are listed, but only abnormal  results are displayed) Labs Reviewed - No data to display  EKG None  Radiology No results found.  Procedures Procedures (including critical care time)  Medications Ordered in UC Medications - No data to display  Initial Impression / Assessment and Plan / UC Course  I have reviewed the triage vital signs and the nursing notes.  Pertinent labs & imaging results that were available during my care of the patient were reviewed by me and considered in my medical decision making (see chart for details).      Final Clinical Impressions(s) / UC Diagnoses   Final diagnoses:  Gastroesophageal reflux disease, esophagitis presence not specified  Nausea without vomiting     Discharge Instructions     Over the counter prilosec take 2 per day Recommend follow up with gastroenterologist    ED Prescriptions    Medication Sig Dispense Auth. Provider   ondansetron (ZOFRAN ODT) 8 MG disintegrating tablet Take 1 tablet (8 mg total) by mouth every 8 (eight) hours as needed. 6 tablet Payton Mccallum, MD      1. diagnosis reviewed with patient 2. rx as per orders above; reviewed possible side effects, interactions, risks and benefits  3. Recommend supportive treatment as above; dietary modifications 4. Follow-up prn if symptoms worsen or don't improve   Controlled Substance Prescriptions [place] Controlled Substance Registry consulted? Not Applicable   Payton Mccallum, MD 02/23/18 1454

## 2018-02-23 NOTE — ED Triage Notes (Signed)
Per patient with stomach issue since he was a baby. Per patient diagnose with GERD. Per patient as an adult has not follow up with an GI.

## 2019-09-30 ENCOUNTER — Encounter: Payer: Self-pay | Admitting: *Deleted

## 2019-09-30 ENCOUNTER — Other Ambulatory Visit: Payer: Self-pay

## 2019-09-30 ENCOUNTER — Emergency Department
Admission: EM | Admit: 2019-09-30 | Discharge: 2019-10-01 | Disposition: A | Discharge status: Home / Self Care | Payer: Self-pay | Attending: Emergency Medicine | Admitting: Emergency Medicine

## 2019-09-30 DIAGNOSIS — R739 Hyperglycemia, unspecified: Secondary | ICD-10-CM

## 2019-09-30 DIAGNOSIS — Z79899 Other long term (current) drug therapy: Secondary | ICD-10-CM | POA: Insufficient documentation

## 2019-09-30 DIAGNOSIS — R531 Weakness: Secondary | ICD-10-CM | POA: Insufficient documentation

## 2019-09-30 DIAGNOSIS — E119 Type 2 diabetes mellitus without complications: Secondary | ICD-10-CM | POA: Insufficient documentation

## 2019-09-30 DIAGNOSIS — E860 Dehydration: Secondary | ICD-10-CM | POA: Insufficient documentation

## 2019-09-30 LAB — GLUCOSE, CAPILLARY: Glucose-Capillary: 352 mg/dL — ABNORMAL HIGH (ref 70–99)

## 2019-09-30 LAB — URINALYSIS, COMPLETE (UACMP) WITH MICROSCOPIC
Bacteria, UA: NONE SEEN
Bilirubin Urine: NEGATIVE
Glucose, UA: >=500 mg/dL — AB
Hgb urine dipstick: NEGATIVE
Ketones, ur: NEGATIVE mg/dL
Leukocytes,Ua: NEGATIVE
Nitrite: NEGATIVE
Protein, ur: NEGATIVE mg/dL
Specific Gravity, Urine: 1.031 — ABNORMAL HIGH (ref 1.005–1.030)
pH: 7 (ref 5.0–8.0)

## 2019-09-30 LAB — COMPREHENSIVE METABOLIC PANEL
ALT: 51 U/L — ABNORMAL HIGH (ref 0–44)
AST: 39 U/L (ref 15–41)
Albumin: 4 g/dL (ref 3.5–5.0)
Alkaline Phosphatase: 97 U/L (ref 38–126)
Anion gap: 9 (ref 5–15)
BUN: 9 mg/dL (ref 6–20)
CO2: 25 mmol/L (ref 22–32)
Calcium: 9 mg/dL (ref 8.9–10.3)
Chloride: 97 mmol/L — ABNORMAL LOW (ref 98–111)
Creatinine, Ser: 1.01 mg/dL (ref 0.61–1.24)
GFR calc Af Amer: >60 mL/min (ref >60)
GFR calc non Af Amer: >60 mL/min (ref >60)
Glucose, Bld: 503 mg/dL (ref 70–99)
Potassium: 4.1 mmol/L (ref 3.5–5.1)
Sodium: 131 mmol/L — ABNORMAL LOW (ref 135–145)
Total Bilirubin: 1 mg/dL (ref 0.3–1.2)
Total Protein: 8 g/dL (ref 6.5–8.1)

## 2019-09-30 LAB — CBC
HCT: 42.1 % (ref 39.0–52.0)
Hemoglobin: 15 g/dL (ref 13.0–17.0)
MCH: 30.6 pg (ref 26.0–34.0)
MCHC: 35.6 g/dL (ref 30.0–36.0)
MCV: 85.9 fL (ref 80.0–100.0)
Platelets: 295 10*3/uL (ref 150–400)
RBC: 4.9 MIL/uL (ref 4.22–5.81)
RDW: 13.2 % (ref 11.5–15.5)
WBC: 11.2 10*3/uL — ABNORMAL HIGH (ref 4.0–10.5)
nRBC: 0 % (ref 0.0–0.2)

## 2019-09-30 MED ORDER — INSULIN ASPART 100 UNIT/ML ~~LOC~~ SOLN: 10.0000 [IU] | Freq: Once | SUBCUTANEOUS | Status: DC

## 2019-09-30 MED ORDER — SODIUM CHLORIDE 0.9 % IV SOLN: Freq: Once | INTRAVENOUS | Status: AC

## 2019-09-30 MED ORDER — SODIUM CHLORIDE 0.9 % IV BOLUS
1000.0000 mL | Freq: Once | INTRAVENOUS | Status: AC
  Administered 2019-09-30: 22:00:00 1000 mL via INTRAVENOUS

## 2019-09-30 MED ORDER — SODIUM CHLORIDE 0.9% FLUSH
3.0000 mL | Freq: Once | INTRAVENOUS | Status: AC
  Administered 2019-09-30: 3 mL via INTRAVENOUS

## 2019-09-30 MED ORDER — SODIUM CHLORIDE 0.9 % IV BOLUS
1000.0000 mL | Freq: Once | INTRAVENOUS | Status: AC
  Administered 2019-09-30: 1000 mL via INTRAVENOUS

## 2019-09-30 NOTE — ED Notes (Signed)
EDP at bedside  

## 2019-09-30 NOTE — ED Notes (Signed)
Pt up to the restroom independently at this time

## 2019-09-30 NOTE — ED Triage Notes (Signed)
PT to ED reporting increased thirst and urination at home with a hx of diabetes in his family. CBG was in the 500s at home.   Pt had second COVID shot today.

## 2019-09-30 NOTE — ED Provider Notes (Signed)
[facility] Emergency Department Provider Note  ____________________________________________   First MD Initiated Contact with Patient 09/30/19 2127     (approximate)  I have reviewed the triage vital signs and the nursing notes.   HISTORY  Chief Complaint Hyperglycemia    HPI William Cohen is a 34 y.o. male  Here with polyuria, polydipsia, fatigue. Pt reports approximately 1 month of progressively worsening increased thirst, increased urination, and now hunger. He has been peeing constnatly and has felt fatigued and tired more than usual. He has a family history of diabetes so became concerned and came for evaluation. Denies known personal h/o diabetes. He does have a PCP. Denies any recent or preceding illness or infection. No chest pain. No other complaints.        Past Medical History:  Diagnosis Date  . Elevated blood pressure   . GERD (gastroesophageal reflux disease)   . Heartburn   . Hernia of abdominal cavity   . Obesity   . Phobia, social   . Seasonal allergies     Patient Active Problem List   Diagnosis Date Noted  . Inattention 06/14/2016  . Obesity 06/03/2015  . Anxiety and depression 06/03/2015  . BMI 40.0-44.9, adult (HCC) 06/03/2015  . Umbilical hernia without obstruction and without gangrene 01/04/2015  . Diastasis recti 01/04/2015    Past Surgical History:  Procedure Laterality Date  . none      Prior to Admission medications   Medication Sig Start Date End Date Taking? Authorizing Provider  Cholecalciferol 2000 units CAPS Take 1 capsule (2,000 Units total) by mouth daily. 06/03/15   Loura Pardon, NP  escitalopram (LEXAPRO) 10 MG tablet TAKE 1 TABLET BY MOUTH ONCE DAILY 10/05/17   Althea Charon, Netta Neat, DO  metFORMIN (GLUCOPHAGE) 500 MG tablet Take 1 tablet (500 mg total) by mouth 2 (two) times daily with a meal for 7 days, THEN 2 tablets (1,000 mg total) 2 (two) times daily with a meal for 23 days. If you have  no GI side effects (nausea, diarrhea), increase to 1000 mg twice a day then continue.. 10/01/19 10/31/19  Shaune Pollack, MD  ondansetron (ZOFRAN ODT) 8 MG disintegrating tablet Take 1 tablet (8 mg total) by mouth every 8 (eight) hours as needed. 02/23/18   Payton Mccallum, MD  ranitidine (ZANTAC) 150 MG capsule Take 150 mg by mouth 2 (two) times daily.    [provider]    Allergies Patient has no known allergies.  Family History  Problem Relation Age of Onset  . Thyroid disease Mother   . Anxiety disorder Mother   . Diabetes Maternal Grandfather   . Heart disease Maternal Grandfather   . Stroke Maternal Grandfather     Social History Social History   Tobacco Use  . Smoking status: Never Smoker  . Smokeless tobacco: Never Used  Substance Use Topics  . Alcohol use: No  . Drug use: No    Review of Systems  Review of Systems  Constitutional: Positive for fatigue.  Endocrine: Positive for polydipsia, polyphagia and polyuria.  Neurological: Positive for weakness.  All other systems reviewed and are negative.    ____________________________________________  PHYSICAL EXAM:      VITAL SIGNS: ED Triage Vitals [09/30/19 2012]  Enc Vitals Group     BP (!) 142/104     Pulse Rate (!) 112     Resp 16     Temp 99 F (37.2 C)     Temp Source Oral  SpO2 97 %     Weight (!) 330 lb 0.5 oz (149.7 kg)     Height 5\' 10"  (1.778 m)     Head Circumference      Peak Flow      Pain Score 0     Pain Loc      Pain Edu?      Excl. in [place]?      Physical Exam Vitals and nursing note reviewed.  Constitutional:      General: He is not in acute distress.    Appearance: He is well-developed.  HENT:     Head: Normocephalic and atraumatic.     Mouth/Throat:     Mouth: Mucous membranes are dry.  Eyes:     Conjunctiva/sclera: Conjunctivae normal.  Cardiovascular:     Rate and Rhythm: Normal rate and regular rhythm.     Heart sounds: Normal heart sounds. No murmur. No  friction rub.  Pulmonary:     Effort: Pulmonary effort is normal. No respiratory distress.     Breath sounds: Normal breath sounds. No wheezing or rales.  Abdominal:     General: There is no distension.     Palpations: Abdomen is soft.     Tenderness: There is no abdominal tenderness.  Musculoskeletal:     Cervical back: Neck supple.  Skin:    General: Skin is warm.     Capillary Refill: Capillary refill takes less than 2 seconds.  Neurological:     Mental Status: He is alert and oriented to person, place, and time.     Motor: No abnormal muscle tone.       ____________________________________________   LABS (all labs ordered are listed, but only abnormal results are displayed)  Labs Reviewed  COMPREHENSIVE METABOLIC PANEL - Abnormal; Notable for the following components:      Result Value   Sodium 131 (*)    Chloride 97 (*)    Glucose, Bld 503 (*)    ALT 51 (*)    All other components within normal limits  CBC - Abnormal; Notable for the following components:   WBC 11.2 (*)    All other components within normal limits  URINALYSIS, COMPLETE (UACMP) WITH MICROSCOPIC - Abnormal; Notable for the following components:   Color, Urine STRAW (*)    APPearance CLEAR (*)    Specific Gravity, Urine 1.031 (*)    Glucose, UA >=500 (*)    All other components within normal limits  GLUCOSE, CAPILLARY - Abnormal; Notable for the following components:   Glucose-Capillary 352 (*)    All other components within normal limits  GLUCOSE, CAPILLARY - Abnormal; Notable for the following components:   Glucose-Capillary 298 (*)    All other components within normal limits  CBG MONITORING, ED  CBG MONITORING, ED    ____________________________________________  EKG: None ________________________________________  RADIOLOGY All imaging, including plain films, CT scans, and ultrasounds, independently reviewed by me, and interpretations confirmed via formal radiology reads.  ED MD  interpretation:   None  Official radiology report(s): No results found.  ____________________________________________  PROCEDURES   Procedure(s) performed (including Critical Care):  Procedures  ____________________________________________  INITIAL IMPRESSION / MDM / [place] / ED COURSE  As part of my medical decision making, I reviewed the following data within the [place] notes reviewed and incorporated, Old chart reviewed, Notes from prior ED visits, and [place] Controlled Substance Database       *RONELLE SMALLMAN was evaluated in  Emergency Department on 10/01/2019 for the symptoms described in the history of present illness. He was evaluated in the context of the global COVID-19 pandemic, which necessitated consideration that the patient might be at risk for infection with the SARS-CoV-2 virus that causes COVID-19. Institutional protocols and algorithms that pertain to the evaluation of patients at risk for COVID-19 are in a state of rapid change based on information released by regulatory bodies including the CDC and federal and state organizations. These policies and algorithms were followed during the patient's care in the ED.  Some ED evaluations and interventions may be delayed as a result of limited staffing during the pandemic.*     Medical Decision Making:  34 yo M here with new onset likely type 2 diabetes. Glu >500 but CO2 normal, normal AG and no ketonuria. No signs of HHS or DKA. His renal function is normal. He was given fluids with significant improvement in his BG and he feels much better. Will start on metformin and refer to his PCP. Discussed dietary changes and importance of weight loss.   ____________________________________________  FINAL CLINICAL IMPRESSION(S) / ED DIAGNOSES  Final diagnoses:  Hyperglycemia  Dehydration  New onset type 2 diabetes mellitus (HCC)     MEDICATIONS GIVEN DURING THIS VISIT:  Medications    sodium chloride flush (NS) 0.9 % injection 3 mL (3 mLs Intravenous Given 09/30/19 2131)  sodium chloride 0.9 % bolus 1,000 mL (0 mLs Intravenous Stopped 09/30/19 2259)  sodium chloride 0.9 % bolus 1,000 mL (0 mLs Intravenous Stopped 09/30/19 2334)  0.9 %  sodium chloride infusion ( Intravenous Stopped 10/01/19 0053)     ED Discharge Orders         Ordered    metFORMIN (GLUCOPHAGE) 500 MG tablet     10/01/19 0013           Note:  This document was prepared using Dragon voice recognition software and may include unintentional dictation errors.   Shaune Pollack, MD 10/01/19 [phone]

## 2019-10-01 LAB — GLUCOSE, CAPILLARY: Glucose-Capillary: 298 mg/dL — ABNORMAL HIGH (ref 70–99)

## 2019-10-01 MED ORDER — METFORMIN HCL 500 MG PO TABS: ORAL_TABLET | ORAL | 0 refills | Status: AC

## 2019-10-01 NOTE — Discharge Instructions (Signed)
Start taking the metformin twice a day. Start with one tablet twice a day for the first week. If you have no nausea or diarrhea, start taking two tablets twice daily.   Call your PCP to set up an appointment

## 2019-10-02 LAB — GLUCOSE, CAPILLARY: Glucose-Capillary: 495 mg/dL — ABNORMAL HIGH (ref 70–99)

## 2022-07-09 ENCOUNTER — Other Ambulatory Visit: Payer: Self-pay | Admitting: Internal Medicine

## 2022-07-09 DIAGNOSIS — E118 Type 2 diabetes mellitus with unspecified complications: Secondary | ICD-10-CM

## 2022-07-15 ENCOUNTER — Other Ambulatory Visit: Payer: Self-pay | Admitting: Internal Medicine

## 2022-07-15 DIAGNOSIS — E782 Mixed hyperlipidemia: Secondary | ICD-10-CM

## 2022-07-15 DIAGNOSIS — E118 Type 2 diabetes mellitus with unspecified complications: Secondary | ICD-10-CM

## 2022-07-15 DIAGNOSIS — F419 Anxiety disorder, unspecified: Secondary | ICD-10-CM

## 2022-07-24 ENCOUNTER — Other Ambulatory Visit: Payer: Self-pay | Admitting: Internal Medicine

## 2022-07-24 DIAGNOSIS — E118 Type 2 diabetes mellitus with unspecified complications: Secondary | ICD-10-CM

## 2022-07-24 DIAGNOSIS — E782 Mixed hyperlipidemia: Secondary | ICD-10-CM

## 2022-07-24 DIAGNOSIS — F32A Depression, unspecified: Secondary | ICD-10-CM

## 2022-08-07 ENCOUNTER — Other Ambulatory Visit: Payer: Self-pay | Admitting: Internal Medicine

## 2022-08-07 DIAGNOSIS — F419 Anxiety disorder, unspecified: Secondary | ICD-10-CM

## 2022-08-07 DIAGNOSIS — E118 Type 2 diabetes mellitus with unspecified complications: Secondary | ICD-10-CM

## 2022-08-07 DIAGNOSIS — E782 Mixed hyperlipidemia: Secondary | ICD-10-CM
